# Patient Record
Sex: Female | Born: 1985 | Race: White | Hispanic: No | Marital: Single
Health system: Southern US, Community
[De-identification: ages and names within clinical notes are randomized; demographics above are authoritative.]

---

## 2012-12-12 ENCOUNTER — Emergency Department (HOSPITAL_COMMUNITY)
Admission: EM | Admit: 2012-12-12 | Discharge: 2012-12-12 | Disposition: A | Discharge status: Home / Self Care | Attending: Emergency Medicine | Admitting: Emergency Medicine

## 2012-12-12 ENCOUNTER — Emergency Department (HOSPITAL_COMMUNITY)

## 2012-12-12 DIAGNOSIS — R61 Generalized hyperhidrosis: Secondary | ICD-10-CM | POA: Insufficient documentation

## 2012-12-12 DIAGNOSIS — R110 Nausea: Secondary | ICD-10-CM | POA: Insufficient documentation

## 2012-12-12 DIAGNOSIS — N390 Urinary tract infection, site not specified: Secondary | ICD-10-CM | POA: Insufficient documentation

## 2012-12-12 DIAGNOSIS — R109 Unspecified abdominal pain: Secondary | ICD-10-CM

## 2012-12-12 DIAGNOSIS — Z3202 Encounter for pregnancy test, result negative: Secondary | ICD-10-CM | POA: Insufficient documentation

## 2012-12-12 LAB — COMPREHENSIVE METABOLIC PANEL
BUN: 7 mg/dL (ref 6–23)
CO2: 26 mEq/L (ref 19–32)
Calcium: 9.6 mg/dL (ref 8.4–10.5)
Creatinine, Ser: 0.65 mg/dL (ref 0.50–1.10)
GFR calc Af Amer: >90 mL/min (ref >90)
GFR calc non Af Amer: >90 mL/min (ref >90)
Glucose, Bld: 96 mg/dL (ref 70–99)
Total Protein: 7.5 g/dL (ref 6.0–8.3)

## 2012-12-12 LAB — URINALYSIS, ROUTINE W REFLEX MICROSCOPIC
Nitrite: POSITIVE — AB
Specific Gravity, Urine: 1.018 (ref 1.005–1.030)
Urobilinogen, UA: 4 mg/dL — ABNORMAL HIGH (ref 0.0–1.0)
pH: 8 (ref 5.0–8.0)

## 2012-12-12 LAB — CBC
HCT: 37.1 % (ref 36.0–46.0)
Hemoglobin: 11.9 g/dL — ABNORMAL LOW (ref 12.0–15.0)
MCH: 25.7 pg — ABNORMAL LOW (ref 26.0–34.0)
MCHC: 32.1 g/dL (ref 30.0–36.0)
MCV: 80.1 fL (ref 78.0–100.0)
RBC: 4.63 MIL/uL (ref 3.87–5.11)

## 2012-12-12 LAB — URINE MICROSCOPIC-ADD ON

## 2012-12-12 MED ORDER — HYDROMORPHONE HCL PF 1 MG/ML IJ SOLN
1.0000 mg | Freq: Once | INTRAMUSCULAR | Status: AC
  Administered 2012-12-12: 1 mg via INTRAVENOUS
  Filled 2012-12-12: qty 1

## 2012-12-12 MED ORDER — DIPHENHYDRAMINE HCL 50 MG/ML IJ SOLN
25.0000 mg | Freq: Once | INTRAMUSCULAR | Status: AC
  Administered 2012-12-12: 25 mg via INTRAVENOUS
  Filled 2012-12-12: qty 1

## 2012-12-12 MED ORDER — SULFAMETHOXAZOLE-TRIMETHOPRIM 800-160 MG PO TABS: 1.0000 | ORAL_TABLET | Freq: Two times a day (BID) | ORAL | Status: AC

## 2012-12-12 MED ORDER — SODIUM CHLORIDE 0.9 % IV BOLUS (SEPSIS): 1000.0000 mL | Freq: Once | INTRAVENOUS | Status: DC

## 2012-12-12 MED ORDER — OXYCODONE-ACETAMINOPHEN 5-325 MG PO TABS: 1.0000 | ORAL_TABLET | ORAL | Status: DC | PRN

## 2012-12-12 MED ORDER — OXYCODONE-ACETAMINOPHEN 5-325 MG PO TABS: 1.0000 | ORAL_TABLET | ORAL | Status: AC | PRN

## 2012-12-12 MED ORDER — SULFAMETHOXAZOLE-TRIMETHOPRIM 800-160 MG PO TABS: 1.0000 | ORAL_TABLET | Freq: Two times a day (BID) | ORAL | Status: DC

## 2012-12-12 MED ORDER — ONDANSETRON HCL 4 MG/2ML IJ SOLN
4.0000 mg | Freq: Once | INTRAMUSCULAR | Status: AC
  Administered 2012-12-12: 4 mg via INTRAVENOUS
  Filled 2012-12-12: qty 2

## 2012-12-12 NOTE — ED Provider Notes (Signed)
History    CSN: [id number] Arrival date & time 12/12/12  1322  First MD Initiated Contact with Patient 12/12/12 1323     No chief complaint on file.  (Consider location/radiation/quality/duration/timing/severity/associated sxs/prior Treatment) HPI  Starlett Pehrson is a(n) 27 y.o. female who presents via EMS with acute LLQ pain. Patient states she was driving with her family back to [place] and stopped for a fast food meal. Within the hour developed acute severe pain in her left lower abdomen. Patient describes pain as a sharp, stabbing pain which radiates to the groin and is unchanged since first starting. States "it feels like my ovary." Pain is colicky. Patient does report a history of IBS, however differentiates this pain from her normal IBS flares as this pain is more localized and severe. Denies hx, kidney stones. Patient denies being sexually active. S/p caesarian section 4 months ago.  Associated symptoms include diaphoresis and nausea Denies fever, chills, vaginal vaginal or urinary sxs. LMP July1   No past medical history on file. No past surgical history on file. No family history on file. History  Substance Use Topics  . Smoking status: Not on file  . Smokeless tobacco: Not on file  . Alcohol Use: Not on file   OB History   No data available     Review of Systems Ten systems reviewed and are negative for acute change, except as noted in the HPI.   Allergies  Review of patient's allergies indicates not on file.  Home Medications  No current outpatient prescriptions on file. There were no vitals taken for this visit. Physical Exam Physical Exam  Nursing note and vitals reviewed. Constitutional: She is oriented to person, place, and time. She appears well-developed and well-nourished. Patient appears in severe discomfort. Interview is intermittently interrupted by  HENT:  Head: Normocephalic and atraumatic.  Eyes: Conjunctivae normal and EOM are normal. Pupils  are equal, round, and reactive to light. No scleral icterus.  Neck: Normal range of motion.  Cardiovascular: Normal rate, regular rhythm and normal heart sounds.  Exam reveals no gallop and no friction rub.   No murmur heard. Pulmonary/Chest: Effort normal and breath sounds normal. No respiratory distress.  Abdominal: Soft. Bowel sounds are normal. She exhibits no distension and no mass. Tenderness to palpation  Neurological: She is alert and oriented to person, place, and time.  Skin: Skin is warm and dry. She is not diaphoretic.    ED Course  Procedures (including critical care time) Labs Reviewed  CBC - Abnormal; Notable for the following:    Hemoglobin 11.9 (*)    MCH 25.7 (*)    RDW 15.8 (*)    All other components within normal limits  COMPREHENSIVE METABOLIC PANEL - Abnormal; Notable for the following:    Potassium 3.1 (*)    ALT 39 (*)    Alkaline Phosphatase 33 (*)    All other components within normal limits  URINALYSIS, ROUTINE W REFLEX MICROSCOPIC - Abnormal; Notable for the following:    Color, Urine RED (*)    Bilirubin Urine MODERATE (*)    Ketones, ur 15 (*)    Urobilinogen, UA 4.0 (*)    Nitrite POSITIVE (*)    Leukocytes, UA SMALL (*)    All other components within normal limits  URINE MICROSCOPIC-ADD ON - Abnormal; Notable for the following:    Bacteria, UA FEW (*)    All other components within normal limits  URINE CULTURE  POCT PREGNANCY, URINE   No results  found. No diagnosis found.  MDM  2:47 PM BP 121/66  Pulse 79  Temp(Src) 98.7 F (37.1 C) (Oral)  Resp 18  SpO2 100% Patient with acute onset LLQ abd pain. VS are WNL.  Negative urine preg.  Concern for ovarian torsion, renal colic, Diverticulitis, ruptured ovarian cyst, constipation. Transvaginal US and doppler ordered. Patient is refusing pelvic exam at this time.  3:53 PM Patient with UTI. She is not nursing. I am giving her IV rocephin. She is awaiting CT scan at this time. To r/o  infected stone.  I have given report to Dr. Warnell Forester who will assume care of the patient. Plan tio d/c with abx/ pain meds.   Arthor Captain, PA-C 12/12/12 1556

## 2012-12-12 NOTE — ED Provider Notes (Signed)
3:48 PM Awaiting CT stone study given abdominal pain and UTI.    5:12 PM CT negative.  Plan DC home.  Abdomen soft without peritoneal signs.  Well appearing.      Candyce Churn, MD 12/12/12 [phone]

## 2012-12-12 NOTE — ED Notes (Signed)
Per pt. Has had 10/10 lower left abdominal pain with nausea. No v/d. Pt alert and oriented.

## 2012-12-12 NOTE — ED Notes (Signed)
Placed pelvic cart to bedside; pt asked for something for nausea; PA notified

## 2012-12-13 NOTE — ED Provider Notes (Signed)
Medical screening examination/treatment/procedure(s) were performed by non-physician practitioner and as supervising physician I was immediately available for consultation/collaboration.    Vida Roller, MD 12/13/12 [phone]

## 2012-12-14 LAB — URINE CULTURE
Colony Count: NO GROWTH
Culture: NO GROWTH

## 2014-05-02 IMAGING — CT CT ABD-PELV W/O CM
2 of 4 series · 17 of 46 positions shown, 19 images · non-contrast
Comparison: None.

CLINICAL DATA: Left lower abdominal pain and flank pain

CT ABDOMEN AND PELVIS WITHOUT CONTRAST
TECHNIQUE: Multidetector CT imaging of the abdomen and pelvis was
performed following the standard protocol without intravenous
contrast.

[Series 2: stone study 5.0 i30f 1 · axial · 0.93mm/px · z∈[-741,-301]mm · 14 of 96 slices shown, 16 images]
[im 4/96  soft-tissue]
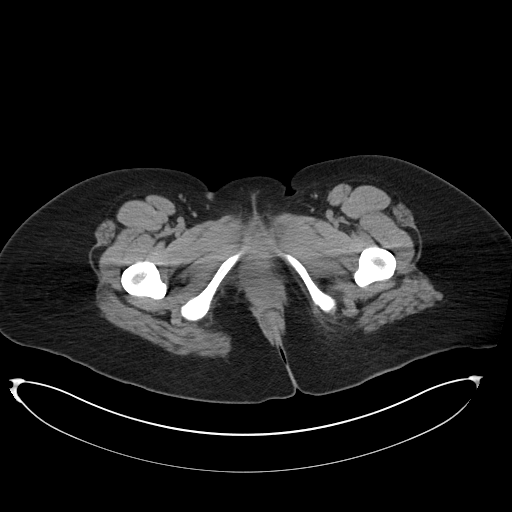
[im 4/96  bone]
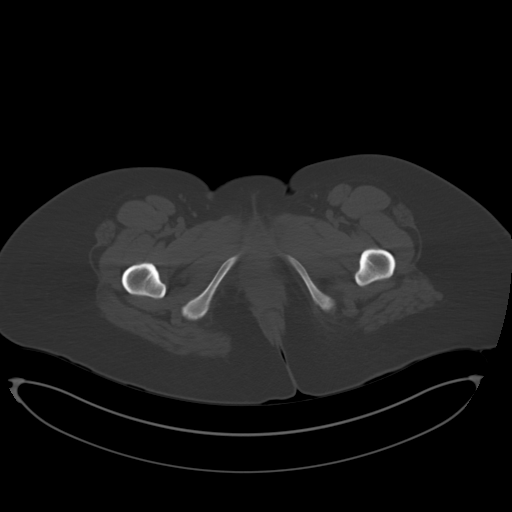
[im 12/96  soft-tissue]
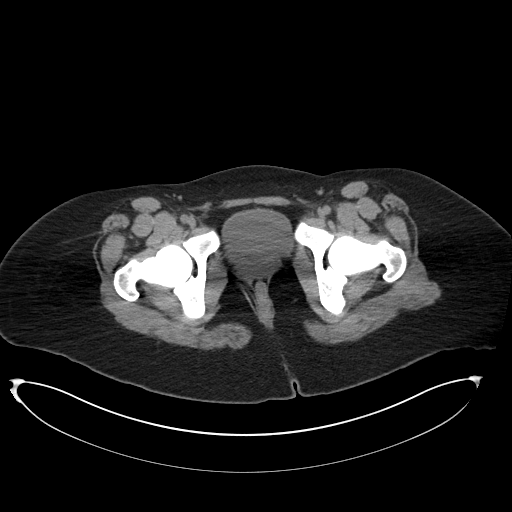
[im 20/96  soft-tissue]
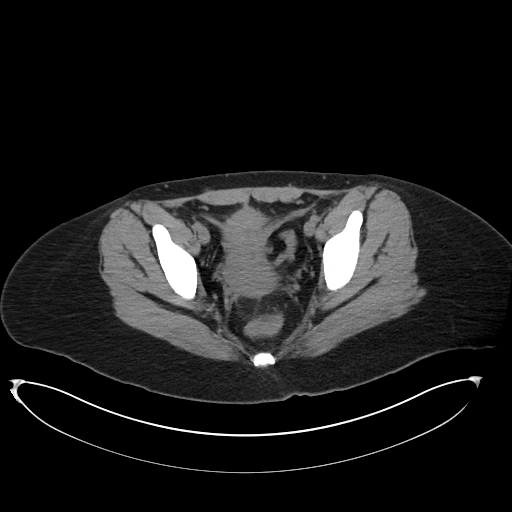
[im 24/96  soft-tissue]
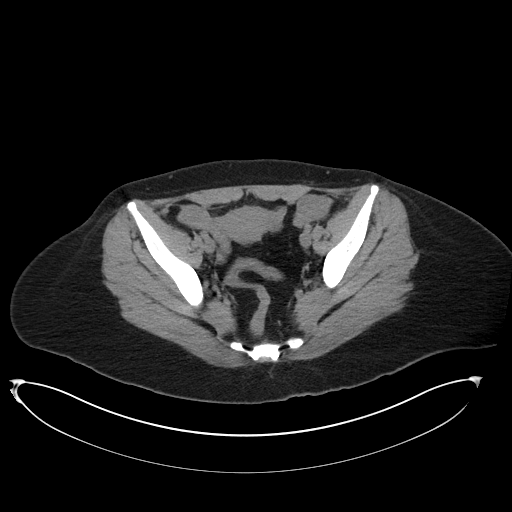
[im 32/96  soft-tissue]
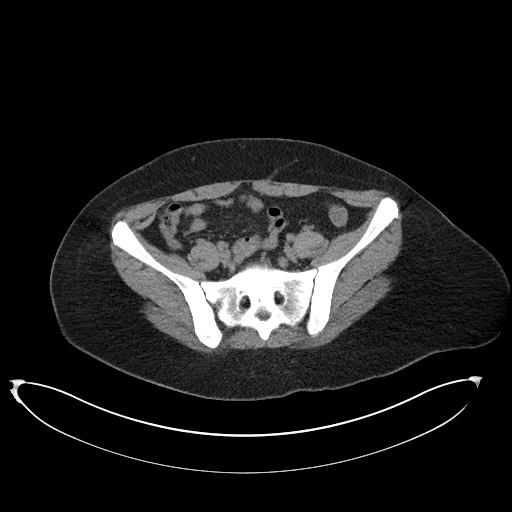
[im 40/96  soft-tissue]
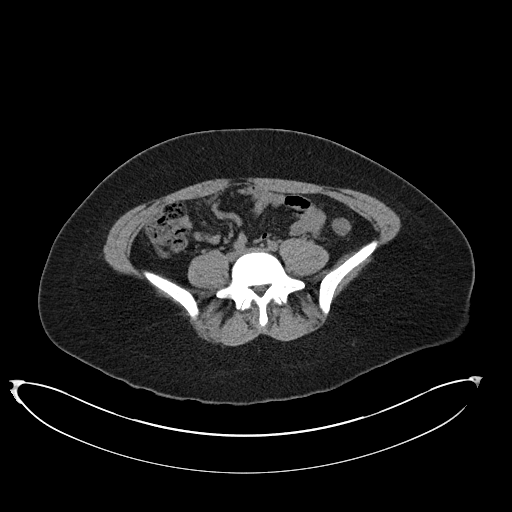
[im 44/96  soft-tissue]
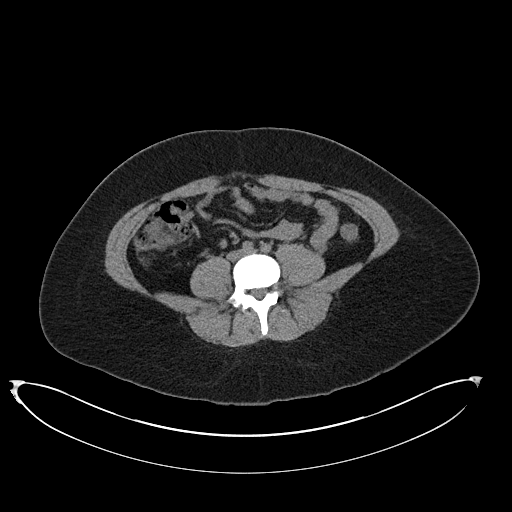
[im 52/96  soft-tissue]
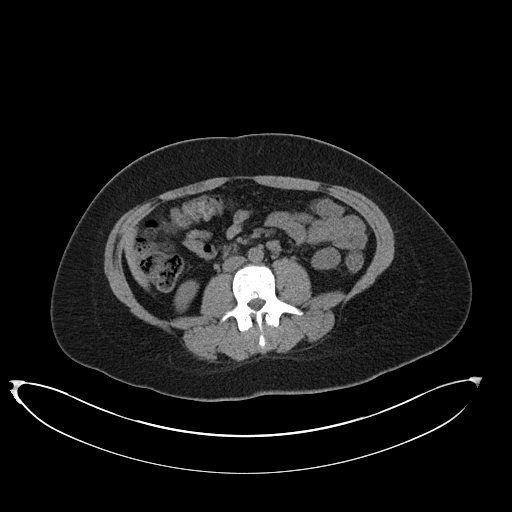
[im 56/96  soft-tissue]
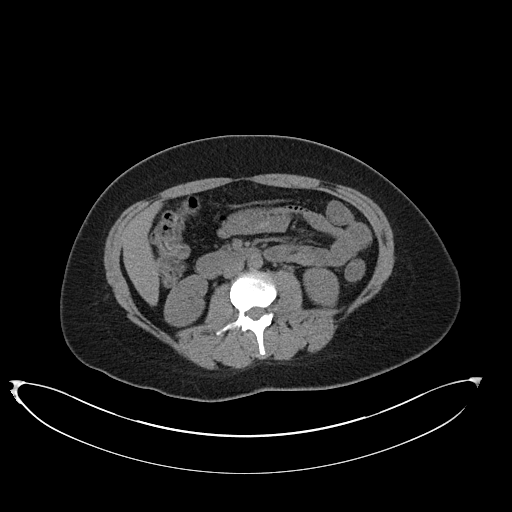
[im 56/96  bone]
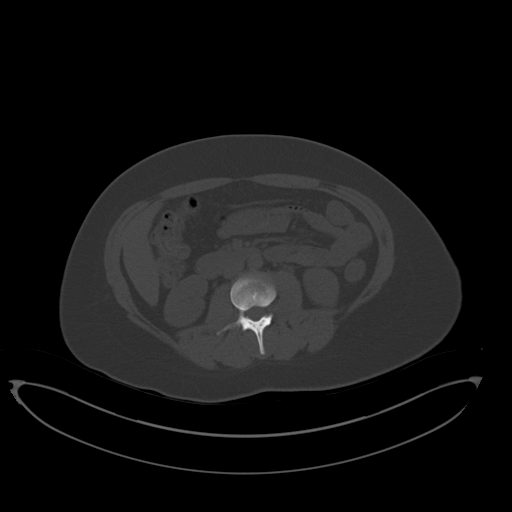
[im 64/96  soft-tissue]
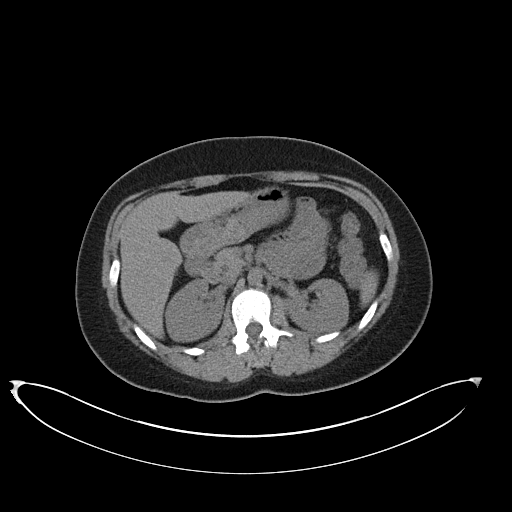
[im 72/96  soft-tissue]
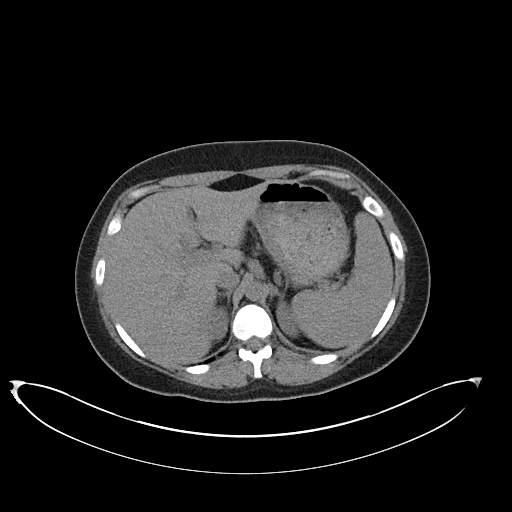
[im 76/96  soft-tissue]
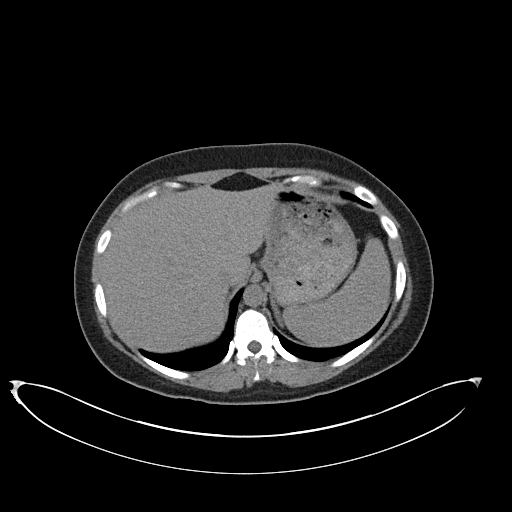
[im 84/96  soft-tissue]
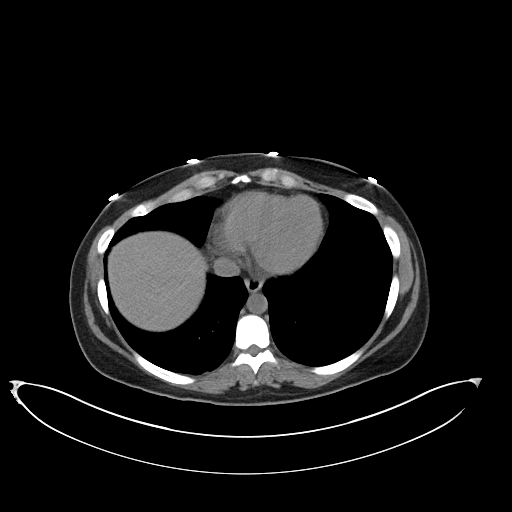
[im 92/96  soft-tissue]
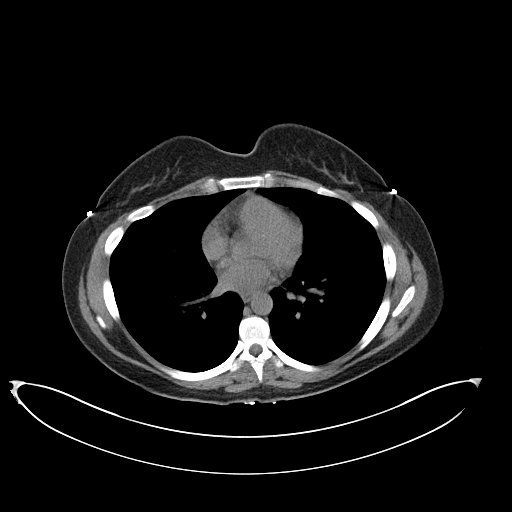

[Series 5: coronal soft tissue · coronal · 0.79mm/px · 3 of 66 slices shown]
[im 22/66  soft-tissue]
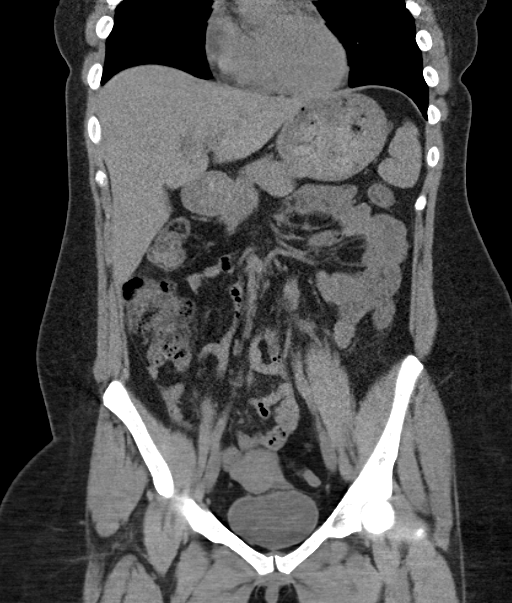
[im 29/66  soft-tissue]
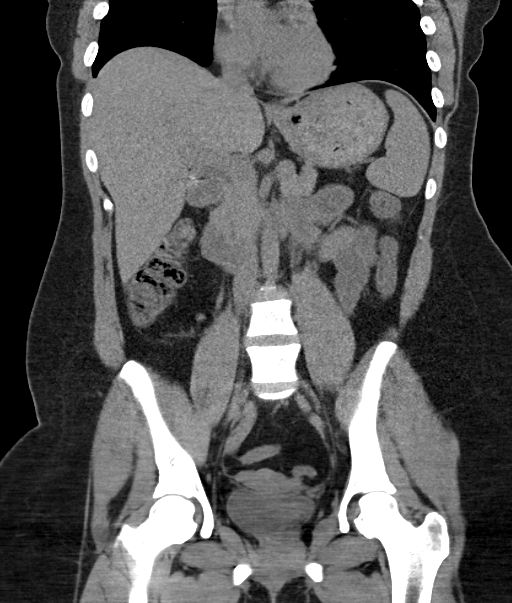
[im 37/66  soft-tissue]
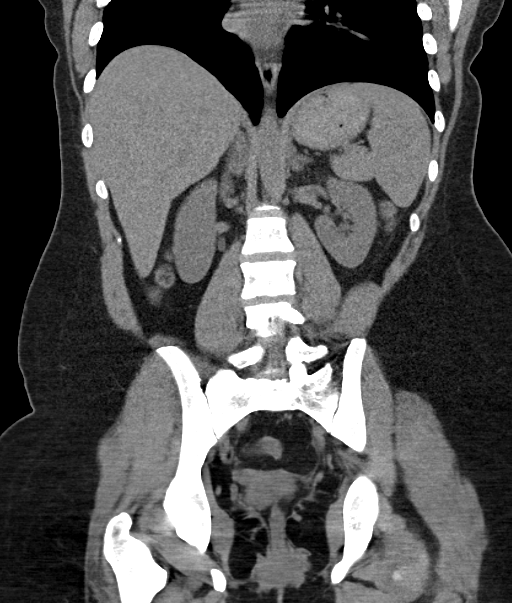

[17 of 46 positions shown; findings below may reference images not displayed]

FINDINGS: Lung bases are clear.  No pericardial fluid.

No focal hepatic lesion.  The gallbladder is surgically absent.
The pancreas, spleen, adrenal glands, and kidneys are normal.  No
nephrolithiasis, ureterolithiasis, or obstructive uropathy.

The stomach, small bowel, appendix, cecum are normal.  The colon
and rectosigmoid colon are normal.

Abdominal aorta is normal caliber.  No retroperitoneal or
periportal lymphadenopathy.  No mesenteric adenopathy.

No free fluid the pelvis.  The bladder, uterus, ovaries are normal.
No pelvic lymphadenopathy.  No inguinal or ventral hernia.  Tiny
umbilical hernia.

Review of  bone windows demonstrates no aggressive osseous lesions.
IMPRESSION: No acute abdominal or pelvic findings.  No explanation for left
flank pain

## 2021-09-22 ENCOUNTER — Other Ambulatory Visit: Payer: Self-pay

## 2021-09-22 DIAGNOSIS — N83201 Unspecified ovarian cyst, right side: Secondary | ICD-10-CM | POA: Insufficient documentation

## 2021-09-22 DIAGNOSIS — R112 Nausea with vomiting, unspecified: Secondary | ICD-10-CM | POA: Insufficient documentation

## 2021-09-22 DIAGNOSIS — R1031 Right lower quadrant pain: Secondary | ICD-10-CM | POA: Diagnosis present

## 2021-09-22 LAB — CBC
HCT: 39.5 % (ref 36.0–46.0)
Hemoglobin: 12.5 g/dL (ref 12.0–15.0)
MCH: 27.9 pg (ref 26.0–34.0)
MCHC: 31.6 g/dL (ref 30.0–36.0)
MCV: 88.2 fL (ref 80.0–100.0)
Platelets: 244 10*3/uL (ref 150–400)
RBC: 4.48 MIL/uL (ref 3.87–5.11)
RDW: 12.7 % (ref 11.5–15.5)
WBC: 9.4 10*3/uL (ref 4.0–10.5)
nRBC: 0 % (ref 0.0–0.2)

## 2021-09-23 ENCOUNTER — Emergency Department: Payer: Medicaid Other

## 2021-09-23 ENCOUNTER — Emergency Department
Admission: EM | Admit: 2021-09-23 | Discharge: 2021-09-23 | Disposition: A | Discharge status: Home / Self Care | Payer: Medicaid Other | Attending: Emergency Medicine | Admitting: Emergency Medicine

## 2021-09-23 ENCOUNTER — Emergency Department (HOSPITAL_COMMUNITY)
Admission: EM | Admit: 2021-09-23 | Discharge: 2021-09-23 | Discharge status: Against Medical Advice | Payer: Medicaid Other | Source: Home / Self Care

## 2021-09-23 DIAGNOSIS — N83201 Unspecified ovarian cyst, right side: Secondary | ICD-10-CM

## 2021-09-23 LAB — URINALYSIS, ROUTINE W REFLEX MICROSCOPIC
Bacteria, UA: NONE SEEN
Bilirubin Urine: NEGATIVE
Glucose, UA: NEGATIVE mg/dL
Ketones, ur: NEGATIVE mg/dL
Leukocytes,Ua: NEGATIVE
Nitrite: NEGATIVE
Protein, ur: NEGATIVE mg/dL
Specific Gravity, Urine: 1.029 (ref 1.005–1.030)
pH: 5 (ref 5.0–8.0)

## 2021-09-23 LAB — COMPREHENSIVE METABOLIC PANEL
ALT: 23 U/L (ref 0–44)
AST: 22 U/L (ref 15–41)
Albumin: 4 g/dL (ref 3.5–5.0)
Alkaline Phosphatase: 39 U/L (ref 38–126)
Anion gap: 6 (ref 5–15)
BUN: 15 mg/dL (ref 6–20)
CO2: 29 mmol/L (ref 22–32)
Calcium: 9.1 mg/dL (ref 8.9–10.3)
Chloride: 103 mmol/L (ref 98–111)
Creatinine, Ser: 0.67 mg/dL (ref 0.44–1.00)
GFR, Estimated: >60 mL/min (ref >60)
Glucose, Bld: 100 mg/dL — ABNORMAL HIGH (ref 70–99)
Potassium: 3.7 mmol/L (ref 3.5–5.1)
Sodium: 138 mmol/L (ref 135–145)
Total Bilirubin: 0.5 mg/dL (ref 0.3–1.2)
Total Protein: 7.4 g/dL (ref 6.5–8.1)

## 2021-09-23 LAB — LIPASE, BLOOD: Lipase: 28 U/L (ref 11–51)

## 2021-09-23 MED ORDER — KETOROLAC TROMETHAMINE 15 MG/ML IJ SOLN
15.0000 mg | Freq: Once | INTRAMUSCULAR | Status: AC
Start: 2021-09-23 — End: 2021-09-23
  Administered 2021-09-23: 15 mg via INTRAVENOUS
  Filled 2021-09-23: qty 1

## 2021-09-23 MED ORDER — ONDANSETRON HCL 4 MG/2ML IJ SOLN
4.0000 mg | Freq: Once | INTRAMUSCULAR | Status: AC
  Administered 2021-09-23: 4 mg via INTRAVENOUS
  Filled 2021-09-23: qty 2

## 2021-09-23 MED ORDER — ONDANSETRON 4 MG PO TBDP: 4.0000 mg | ORAL_TABLET | Freq: Three times a day (TID) | ORAL | 0 refills | Status: AC | PRN

## 2021-09-23 NOTE — ED Notes (Signed)
When going to discharge pt, she requests if she can stay in the room a bit longer due to being really sleepy from taking a benadryl before coming to the ED. MD notified and aware. ?

## 2021-09-23 NOTE — ED Provider Notes (Signed)
? ?[facility] ?Provider Note ? ? ? Event Date/Time  ? First MD Initiated Contact with Patient 09/23/21 0023   ?  (approximate) ? ? ?History  ? ?Abdominal Pain ? ? ?HPI ? ?Theresa Guerrero is a 36 y.o. female no significant past medical history who presents for evaluation of right-sided abdominal pain.  Patient reports sudden onset of pain this evening which has been sharp and she describes as feeling like contractions.  The pain comes and goes.  When it comes on is very severe.  The pain is located in the right lower quadrant and associated with nausea and vomiting.  No vaginal bleeding or vaginal discharge.  Patient is status post hysterectomy and cholecystectomy.  She denies fever or chills, dysuria or hematuria, chest pain or shortness of breath.  She reports that the pain radiates to her back. ?  ? ?PMH ?Hysterectomy ?Cholecystectomy ? ? ?Physical Exam  ? ?Triage Vital Signs: ?ED Triage Vitals  ?Enc Vitals Group  ?   BP 09/22/21 2355 116/64  ?   Pulse Rate 09/22/21 2355 89  ?   Resp 09/22/21 2355 20  ?   Temp 09/22/21 2355 98.3 ?F (36.8 ?C)  ?   Temp Source 09/22/21 2355 Oral  ?   SpO2 09/22/21 2355 100 %  ?   Weight 09/22/21 2345 210 lb (95.3 kg)  ?   Height 09/22/21 2345 5\' 5"  (1.651 m)  ?   Head Circumference --   ?   Peak Flow --   ?   Pain Score 09/22/21 2344 8  ?   Pain Loc --   ?   Pain Edu? --   ?   Excl. in GC? --   ? ? ?Most recent vital signs: ?Vitals:  ? 09/23/21 0230 09/23/21 0300  ?BP: (!) 105/52 (!) 99/48  ?Pulse: 88 90  ?Resp: 17 15  ?Temp:    ?SpO2: 100% 100%  ? ? ? ?Constitutional: Alert and oriented. Well appearing and in no apparent distress. ?HEENT: ?     Head: Normocephalic and atraumatic.    ?     Eyes: Conjunctivae are normal. Sclera is non-icteric.  ?     Mouth/Throat: Mucous membranes are moist.  ?     Neck: Supple with no signs of meningismus. ?Cardiovascular: Regular rate and rhythm. No murmurs, gallops, or rubs. 2+ symmetrical distal pulses are present in all  extremities.  ?Respiratory: Normal respiratory effort. Lungs are clear to auscultation bilaterally.  ?Gastrointestinal: Soft, patient is tender to palpation on the right lower quadrant and left lower quadrant with no rebound or guarding  ?Genitourinary: No CVA tenderness. ?Musculoskeletal:  No edema, cyanosis, or erythema of extremities. ?Neurologic: Normal speech and language. Face is symmetric. Moving all extremities. No gross focal neurologic deficits are appreciated. ?Skin: Skin is warm, dry and intact. No rash noted. ?Psychiatric: Mood and affect are normal. Speech and behavior are normal. ? ?ED Results / Procedures / Treatments  ? ?Labs ?(all labs ordered are listed, but only abnormal results are displayed) ?Labs Reviewed  ?COMPREHENSIVE METABOLIC PANEL - Abnormal; Notable for the following components:  ?    Result Value  ? Glucose, Bld 100 (*)   ? All other components within normal limits  ?URINALYSIS, ROUTINE W REFLEX MICROSCOPIC - Abnormal; Notable for the following components:  ? Color, Urine YELLOW (*)   ? APPearance CLEAR (*)   ? Hgb urine dipstick SMALL (*)   ? All other components within normal limits  ?  LIPASE, BLOOD  ?CBC  ? ? ? ?EKG ? ?none ? ? ?RADIOLOGY ?I, Nita Sickle, attending MD, have personally viewed and interpreted the images obtained during this visit as below: ? ?CT renal negative for kidney stone or any other pathology ? ? ?___________________________________________________ ?Interpretation by Radiologist:  ?CT Renal Stone Study ? ?Result Date: 09/23/2021 ?CLINICAL DATA:  Right-sided flank pain EXAM: CT ABDOMEN AND PELVIS WITHOUT CONTRAST TECHNIQUE: Multidetector CT imaging of the abdomen and pelvis was performed following the standard protocol without IV contrast. RADIATION DOSE REDUCTION: This exam was performed according to the departmental dose-optimization program which includes automated exposure control, adjustment of the mA and/or kV according to patient size and/or use of  iterative reconstruction technique. COMPARISON:  None. FINDINGS: Lower chest: No acute abnormality. Hepatobiliary: No focal liver abnormality is seen. Status post cholecystectomy. No biliary dilatation. Pancreas: Unremarkable. No pancreatic ductal dilatation or surrounding inflammatory changes. Spleen: Normal in size without focal abnormality. Adrenals/Urinary Tract: Adrenal glands are within normal limits. Kidneys demonstrate no renal calculi. No obstructive changes are noted. Bladder is decompressed. Stomach/Bowel: The appendix is well visualized. No inflammatory changes are seen. Stomach, small bowel and colon are within normal limits. Vascular/Lymphatic: No significant vascular findings are present. No enlarged abdominal or pelvic lymph nodes. Reproductive: Ovaries are within normal limits. The uterus is not well visualized. Correlate with surgical history. Other: No abdominal wall hernia or abnormality. No abdominopelvic ascites. Musculoskeletal: No acute or significant osseous findings. IMPRESSION: No renal calculi or obstructive changes are noted. No acute abnormality is seen. Electronically Signed   By: Alcide Clever M.D.   On: 09/23/2021 00:54  ? ?US PELVIC COMPLETE W TRANSVAGINAL AND TORSION R/O ? ?Result Date: 09/23/2021 ?CLINICAL DATA:  Right-sided abdominal pain EXAM: TRANSABDOMINAL AND TRANSVAGINAL ULTRASOUND OF PELVIS DOPPLER ULTRASOUND OF OVARIES TECHNIQUE: Both transabdominal and transvaginal ultrasound examinations of the pelvis were performed. Transabdominal technique was performed for global imaging of the pelvis including uterus, ovaries, adnexal regions, and pelvic cul-de-sac. It was necessary to proceed with endovaginal exam following the transabdominal exam to visualize the ovaries. Color and duplex Doppler ultrasound was utilized to evaluate blood flow to the ovaries. COMPARISON:  CT from earlier in the same day. FINDINGS: Uterus Surgically removed Right ovary Measurements: 3.2 x 2.2 x 2.7 cm.  = volume: 9.8 mL. Adjacent to the right ovary there is a 2.5 x 1.7 x 2.4 cm partially cystic structure identified. This may represent an exophytic cyst from the right ovary. Left ovary Measurements: 4.1 x 3.1 x 2.6 cm. = volume: 17.3 mL. Normal appearance/no adnexal mass. Pulsed Doppler evaluation of both ovaries demonstrates normal low-resistance arterial and venous waveforms. Other findings No abnormal free fluid. IMPRESSION: Status post hysterectomy. Partially cystic structure is noted adjacent to the right adnexa as described. This may represent an exophytic complex cyst. Follow-up as clinically indicated. No findings to suggest ovarian torsion are seen. Electronically Signed   By: Alcide Clever M.D.   On: 09/23/2021 03:12   ? ? ? ? ?PROCEDURES: ? ?Critical Care performed: No ? ?Procedures ? ? ? ?IMPRESSION / MDM / ASSESSMENT AND PLAN / ED COURSE  ?I reviewed the triage vital signs and the nursing notes. ? ?36 y.o. female no significant past medical history who presents for evaluation of right-sided abdominal pain.  Patient presents with a few hours of intermittent sharp "contraction-like" RLQ abd pain radiating to the back associated with nausea and vomiting.  On exam she looks slightly uncomfortable but in no distress, normal  vitals, abdomen is soft with right lower and left lower quadrant tenderness but no rebound or guarding ? ?Ddx: Kidney stone versus ovarian torsion versus ovarian cyst versus SBO versus UTI versus STD versus ectopic versus appendicitis ? ? ?Plan: CBC, CMP, lipase, UA. CT renal. IV toradol and zofran for symptoms relief ? ? ?MEDICATIONS GIVEN IN ED: ?Medications  ?ketorolac (TORADOL) 15 MG/ML injection 15 mg (15 mg Intravenous Given 09/23/21 0142)  ?ondansetron [facility]) injection 4 mg (4 mg Intravenous Given 09/23/21 0143)  ? ? ? ?ED COURSE: No leukocytosis, normal LFTs and lipase.  CT renal with no acute findings.  Ultrasound of the pelvis has been ordered to rule out ovarian torsion.  UA is  pending. ? ?_________________________ ?3:31 AM on 09/23/2021 ?----------------------------------------- ?UA negative for UTI.  Ultrasound showing a right-sided ovarian cyst with no signs of torsion.  Patient's

## 2021-09-23 NOTE — Discharge Instructions (Signed)
As we discussed, having a cyst in your ovary can increase the chance of you developing ovarian torsion.  This is a medical emergency and without emergent surgery you may lose your ovary.  Therefore if you have new or worsening right-sided abdominal pain please return to the emergency room right away.  You can take ibuprofen and Tylenol at home.  Zofran as needed for nausea and vomiting.  Follow-up with OB/GYN in a week. ?

## 2021-09-26 ENCOUNTER — Telehealth: Payer: Self-pay

## 2021-09-26 NOTE — Telephone Encounter (Signed)
-----   Message from Darrel Hoover, RN sent at 09/25/2021 10:46 AM EDT ----- Regarding: referral Schedule with [facility] - right ovarian cyst eval in 3-4 weeks.

## 2021-09-26 NOTE — Telephone Encounter (Signed)
[facility] ED dept referring for Right ovarian cyst eval. Sch with [facility] in 3-4 weeks. Called and left voicemail for patient to call back to be scheduled.

## 2021-09-27 NOTE — Telephone Encounter (Signed)
Called and left voicemail for patient to call back to be scheduled. 

## 2021-10-01 NOTE — Telephone Encounter (Signed)
I contacted patient via phone. Patient states she has already follow up on her cyst and and doesn't need to scheduled with [place]. To cancel referral.

## 2022-02-05 ENCOUNTER — Encounter: Payer: Self-pay | Admitting: Emergency Medicine

## 2022-02-05 DIAGNOSIS — J069 Acute upper respiratory infection, unspecified: Secondary | ICD-10-CM | POA: Diagnosis not present

## 2022-02-05 DIAGNOSIS — J40 Bronchitis, not specified as acute or chronic: Secondary | ICD-10-CM | POA: Insufficient documentation

## 2022-02-05 DIAGNOSIS — R059 Cough, unspecified: Secondary | ICD-10-CM | POA: Diagnosis present

## 2022-02-05 DIAGNOSIS — Z20822 Contact with and (suspected) exposure to covid-19: Secondary | ICD-10-CM | POA: Insufficient documentation

## 2022-02-05 NOTE — ED Triage Notes (Signed)
Pt c/o night time coughing and sore throat x5 days. Pt denies SOB. Expiratory wheezing present but minimal.

## 2022-02-06 ENCOUNTER — Emergency Department
Admission: EM | Admit: 2022-02-06 | Discharge: 2022-02-06 | Disposition: A | Discharge status: Home / Self Care | Payer: Medicaid Other | Attending: Emergency Medicine | Admitting: Emergency Medicine

## 2022-02-06 ENCOUNTER — Emergency Department: Payer: Medicaid Other

## 2022-02-06 DIAGNOSIS — R051 Acute cough: Secondary | ICD-10-CM

## 2022-02-06 DIAGNOSIS — J069 Acute upper respiratory infection, unspecified: Secondary | ICD-10-CM

## 2022-02-06 DIAGNOSIS — J40 Bronchitis, not specified as acute or chronic: Secondary | ICD-10-CM

## 2022-02-06 LAB — SARS CORONAVIRUS 2 BY RT PCR: SARS Coronavirus 2 by RT PCR: NEGATIVE

## 2022-02-06 NOTE — ED Notes (Signed)
E-signature pad unavailable - Pt verbalized understanding of D/C information - no additional concerns at this time.  

## 2022-02-06 NOTE — ED Provider Notes (Signed)
[facility] Provider Note    Event Date/Time   First MD Initiated Contact with Patient 02/06/22 [phone]     (approximate)   History   Cough   HPI  Theresa Guerrero is a 36 y.o. female who presents to the ED for evaluation of Cough   Patient presents to the ED for evaluation of 4-5 days of a nonproductive cough.  She reports her external neck bilaterally feels sore but she does not have odynophagia or "internal" sore throat.  No fever, chest pain, dyspnea or systemic symptoms.  No history of asthma or smoking.   Physical Exam   Triage Vital Signs: ED Triage Vitals  Enc Vitals Group     BP 02/05/22 2355 (!) 117/56     Pulse Rate 02/05/22 2355 96     Resp 02/05/22 2355 18     Temp 02/05/22 2355 98.3 F (36.8 C)     Temp Source 02/05/22 2355 Oral     SpO2 02/05/22 2355 97 %     Weight 02/05/22 2357 218 lb (98.9 kg)     Height 02/05/22 2357 5\' 5"  (1.651 m)     Head Circumference --      Peak Flow --      Pain Score --      Pain Loc --      Pain Edu? --      Excl. in GC? --     Most recent vital signs: Vitals:   02/05/22 2355 02/06/22 0235  BP: (!) 117/56 121/60  Pulse: 96 90  Resp: 18   Temp: 98.3 F (36.8 C)   SpO2: 97% 98%    General: Awake, no distress.  Pleasant and conversational full sentences with normal voice.  Opens mouth widely with a midline uvula without evidence of posterior oropharyngeal erythema or airway obstruction. Some small subcentimeter palpable anterior cervical lymphadenopathy is noted. CV:  Good peripheral perfusion. RRR Resp:  Normal effort. CTAB Abd:  No distention.  MSK:  No deformity noted.  Neuro:  No focal deficits appreciated. Other:     ED Results / Procedures / Treatments   Labs (all labs ordered are listed, but only abnormal results are displayed) Labs Reviewed  SARS CORONAVIRUS 2 BY RT PCR    EKG   RADIOLOGY 2 view CXR interpreted by me without evidence of acute cardiopulmonary  pathology.  Official radiology report(s): DG Chest 2 View  Result Date: 02/06/2022 CLINICAL DATA:  Cough, wheezing EXAM: CHEST - 2 VIEW COMPARISON:  None Available. FINDINGS: The heart size and mediastinal contours are within normal limits. Both lungs are clear. The visualized skeletal structures are unremarkable. IMPRESSION: Normal study. Electronically Signed   By: 02/08/2022 M.D.   On: 02/06/2022 00:38    PROCEDURES and INTERVENTIONS:  Procedures  Medications - No data to display   IMPRESSION / MDM / ASSESSMENT AND PLAN / ED COURSE  I reviewed the triage vital signs and the nursing notes.  Differential diagnosis includes, but is not limited to, viral syndrome, bronchitis, peritonsillar abscess, covid, pneumonia, PTX  {Patient presents with symptoms of an acute illness or injury that is potentially life-threatening.  36 year old woman presents with nonproductive cough for a few days, likely bronchitis or a viral etiology and suitable for outpatient management.  Looks systemically well.  CXR is clear.  COVID test is negative.  I see no indications for antibiotics.  We discussed supportive care and return precautions.      FINAL CLINICAL IMPRESSION(S) /  ED DIAGNOSES   Final diagnoses:  Acute cough  Viral URI with cough  Bronchitis     Rx / DC Orders   ED Discharge Orders     None        Note:  This document was prepared using Dragon voice recognition software and may include unintentional dictation errors.   Delton [place], MD 02/06/22 [phone]

## 2022-02-06 NOTE — Discharge Instructions (Addendum)
Please take Tylenol and ibuprofen/Advil for your pain.  It is safe to take them together, or to alternate them every few hours.  Take up to 1000mg of Tylenol at a time, up to 4 times per day.  Do not take more than 4000 mg of Tylenol in 24 hours.  For ibuprofen, take 400-600 mg, 3 - 4 times per day.  

## 2023-02-11 IMAGING — US US PELVIS COMPLETE TRANSABD/TRANSVAG W DUPLEX AND/OR DOPPLER
1 series · 13 of 25 positions shown · non-contrast
Comparison: CT from earlier in the same day.

CLINICAL DATA: Right-sided abdominal pain

EXAM:
TRANSABDOMINAL AND TRANSVAGINAL ULTRASOUND OF PELVIS
DOPPLER ULTRASOUND OF OVARIES
TECHNIQUE: Both transabdominal and transvaginal ultrasound examinations of the
pelvis were performed. Transabdominal technique was performed for
global imaging of the pelvis including uterus, ovaries, adnexal
regions, and pelvic cul-de-sac.
It was necessary to proceed with endovaginal exam following the
transabdominal exam to visualize the ovaries. Color and duplex
Doppler ultrasound was utilized to evaluate blood flow to the
ovaries.

[Series 1: us pelvic complete w transvaginal and torsion righ · 73 acquisitions, 13 frames shown]
[im 1/73]
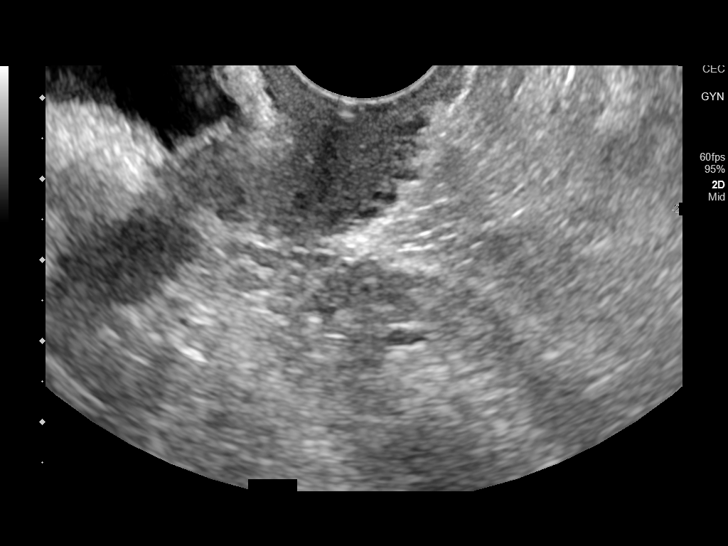
[im 7/73]
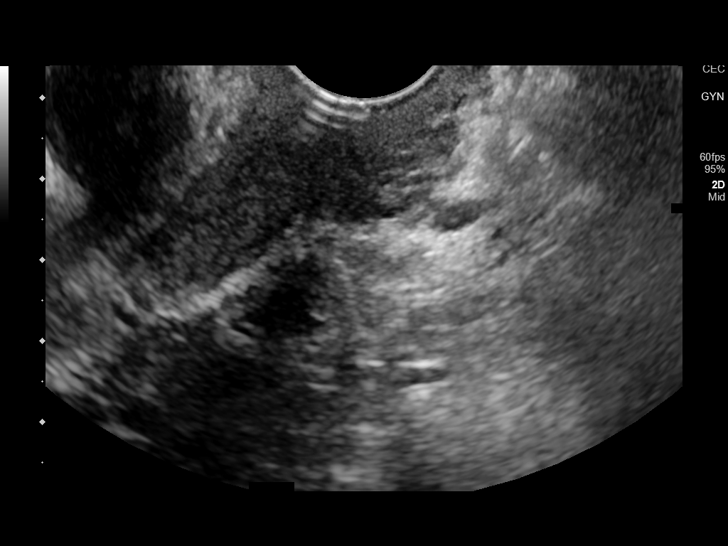
[im 13/73]
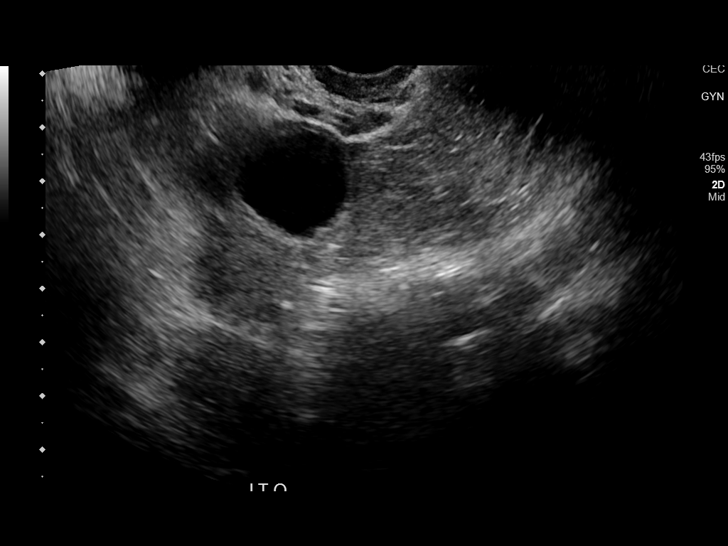
[im 19/73]
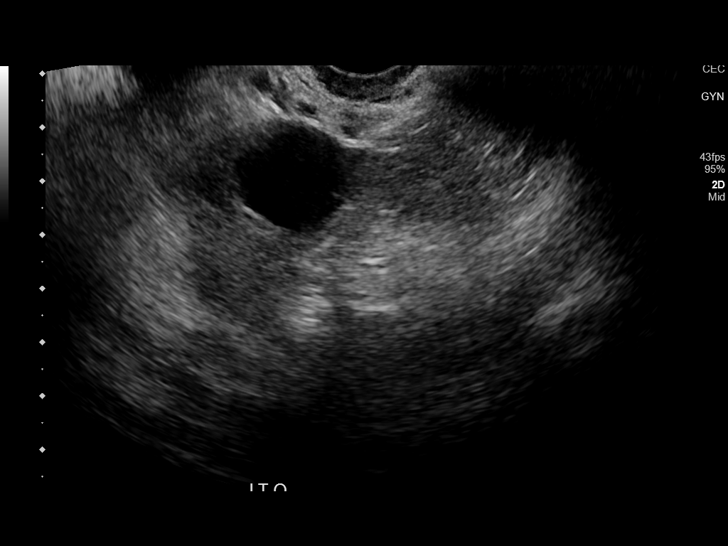
[im 25/73]
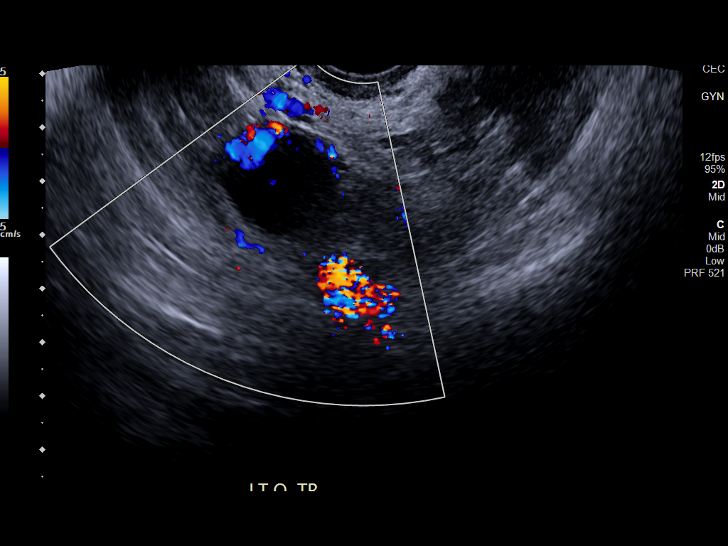
[im 31/73]
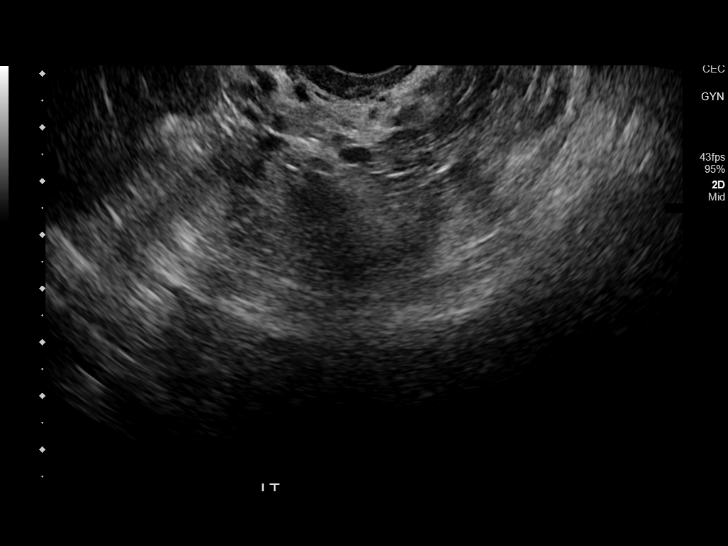
[im 37/73]
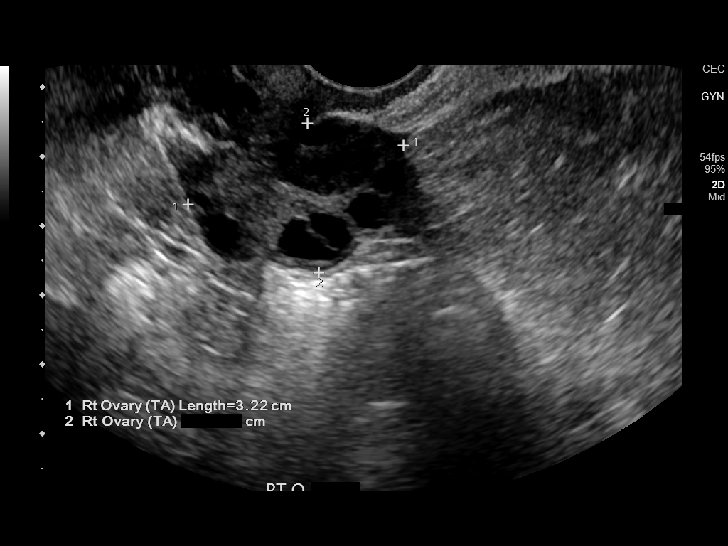
[im 43/73]
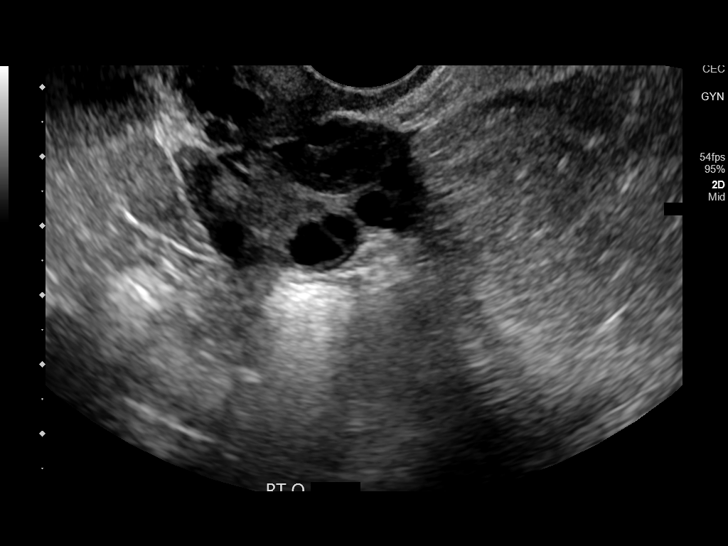
[im 49/73]
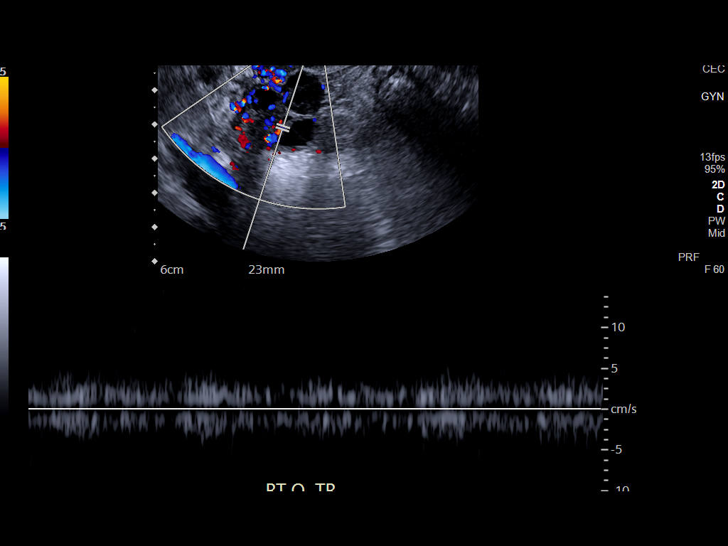
[im 55/73]
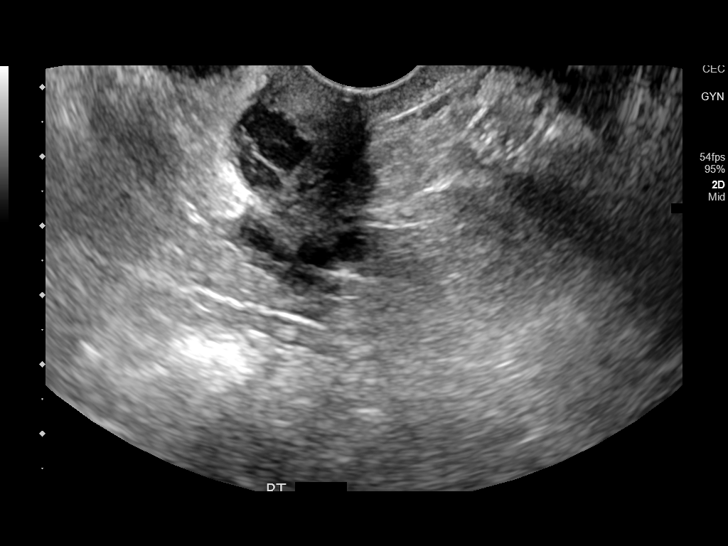
[im 61/73]
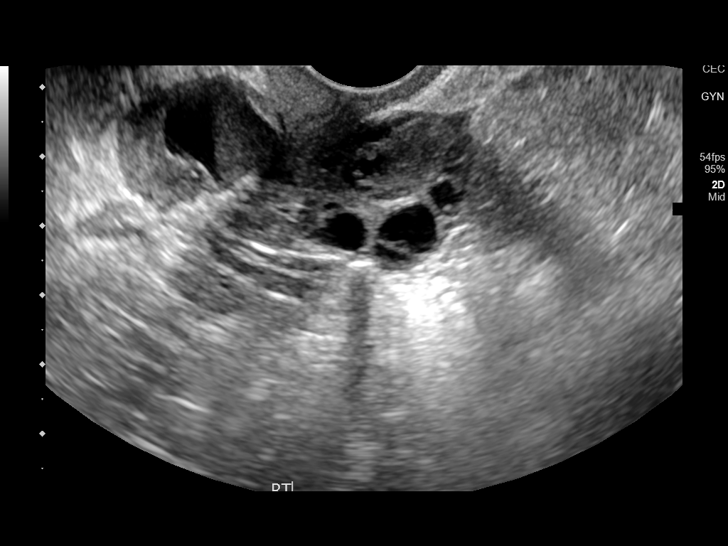
[im 67/73]
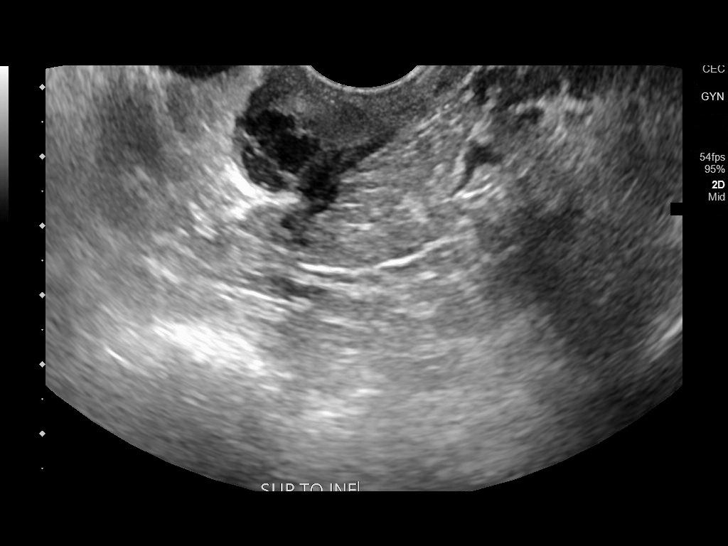
[im 73/73]
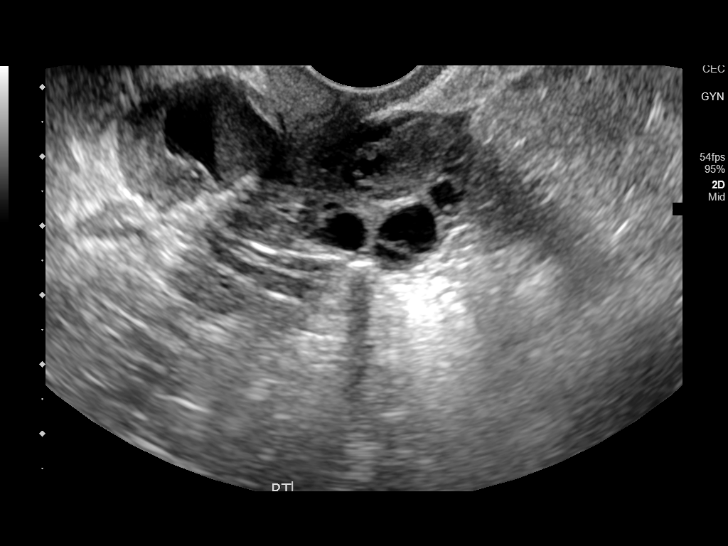

[13 of 25 positions shown; findings below may reference images not displayed]

FINDINGS: Uterus

Surgically removed

Right ovary

Measurements: 3.2 x 2.2 x 2.7 cm. = volume: 9.8 mL. Adjacent to the
right ovary there is a 2.5 x 1.7 x 2.4 cm partially cystic structure
identified. This may represent an exophytic cyst from the right
ovary.

Left ovary

Measurements: 4.1 x 3.1 x 2.6 cm. = volume: 17.3 mL. Normal
appearance/no adnexal mass.

Pulsed Doppler evaluation of both ovaries demonstrates normal
low-resistance arterial and venous waveforms.

Other findings

No abnormal free fluid.
IMPRESSION: Status post hysterectomy.

Partially cystic structure is noted adjacent to the right adnexa as
described. This may represent an exophytic complex cyst. Follow-up
as clinically indicated.

No findings to suggest ovarian torsion are seen.
